# Patient Record
Sex: Female | Born: 2000 | Race: White | Hispanic: No | Marital: Single | Smoking: Never smoker
Health system: Southern US, Community
[De-identification: ages and names within clinical notes are randomized; demographics above are authoritative.]

## PROBLEM LIST (undated history)

## (undated) DIAGNOSIS — F329 Major depressive disorder, single episode, unspecified: Secondary | ICD-10-CM

## (undated) DIAGNOSIS — F32A Depression, unspecified: Secondary | ICD-10-CM

## (undated) DIAGNOSIS — F419 Anxiety disorder, unspecified: Secondary | ICD-10-CM

## (undated) DIAGNOSIS — F4310 Post-traumatic stress disorder, unspecified: Secondary | ICD-10-CM

## (undated) DIAGNOSIS — E343 Short stature due to endocrine disorder: Secondary | ICD-10-CM

## (undated) DIAGNOSIS — F909 Attention-deficit hyperactivity disorder, unspecified type: Secondary | ICD-10-CM

## (undated) DIAGNOSIS — E34328 Other genetic causes of short stature: Secondary | ICD-10-CM

## (undated) HISTORY — PX: DENTAL SURGERY: SHX609

---
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
## 2024-06-19 ENCOUNTER — Encounter: Payer: Self-pay | Admitting: Emergency Medicine

## 2024-06-19 ENCOUNTER — Emergency Department
Admission: EM | Admit: 2024-06-19 | Discharge: 2024-06-19 | Disposition: A | Discharge status: Home / Self Care | Attending: Emergency Medicine | Admitting: Emergency Medicine

## 2024-06-19 ENCOUNTER — Other Ambulatory Visit: Payer: Self-pay

## 2024-06-19 DIAGNOSIS — N920 Excessive and frequent menstruation with regular cycle: Secondary | ICD-10-CM | POA: Diagnosis not present

## 2024-06-19 DIAGNOSIS — R109 Unspecified abdominal pain: Secondary | ICD-10-CM

## 2024-06-19 DIAGNOSIS — R110 Nausea: Secondary | ICD-10-CM | POA: Diagnosis not present

## 2024-06-19 DIAGNOSIS — R1030 Lower abdominal pain, unspecified: Secondary | ICD-10-CM | POA: Diagnosis present

## 2024-06-19 LAB — COMPREHENSIVE METABOLIC PANEL WITH GFR
ALT: 11 U/L (ref 0–44)
AST: 31 U/L (ref 15–41)
Albumin: 4.5 g/dL (ref 3.5–5.0)
Alkaline Phosphatase: 56 U/L (ref 38–126)
Anion gap: 15 (ref 5–15)
BUN: 9 mg/dL (ref 6–20)
CO2: 17 mmol/L — ABNORMAL LOW (ref 22–32)
Calcium: 9.8 mg/dL (ref 8.9–10.3)
Chloride: 108 mmol/L (ref 98–111)
Creatinine, Ser: 0.88 mg/dL (ref 0.44–1.00)
GFR, Estimated: >60 mL/min (ref >60)
Glucose, Bld: 113 mg/dL — ABNORMAL HIGH (ref 70–99)
Potassium: 3.8 mmol/L (ref 3.5–5.1)
Sodium: 140 mmol/L (ref 135–145)
Total Bilirubin: 1.1 mg/dL (ref 0.0–1.2)
Total Protein: 7.4 g/dL (ref 6.5–8.1)

## 2024-06-19 LAB — CBC
HCT: 36.5 % (ref 36.0–46.0)
Hemoglobin: 12.7 g/dL (ref 12.0–15.0)
MCH: 31.5 pg (ref 26.0–34.0)
MCHC: 34.8 g/dL (ref 30.0–36.0)
MCV: 90.6 fL (ref 80.0–100.0)
Platelets: 260 K/uL (ref 150–400)
RBC: 4.03 MIL/uL (ref 3.87–5.11)
RDW: 11.7 % (ref 11.5–15.5)
WBC: 10.3 K/uL (ref 4.0–10.5)
nRBC: 0 % (ref 0.0–0.2)

## 2024-06-19 LAB — URINALYSIS, ROUTINE W REFLEX MICROSCOPIC
Bilirubin Urine: NEGATIVE
Glucose, UA: NEGATIVE mg/dL
Ketones, ur: 5 mg/dL — AB
Leukocytes,Ua: NEGATIVE
Nitrite: NEGATIVE
Protein, ur: 30 mg/dL — AB
Specific Gravity, Urine: 1.027 (ref 1.005–1.030)
pH: 6 (ref 5.0–8.0)

## 2024-06-19 LAB — LIPASE, BLOOD: Lipase: 27 U/L (ref 11–51)

## 2024-06-19 LAB — POC URINE PREG, ED: Preg Test, Ur: NEGATIVE

## 2024-06-19 MED ORDER — ONDANSETRON 4 MG PO TBDP
4.0000 mg | ORAL_TABLET | Freq: Once | ORAL | Status: AC
  Administered 2024-06-19: 4 mg via ORAL
  Filled 2024-06-19: qty 1

## 2024-06-19 MED ORDER — ONDANSETRON 4 MG PO TBDP: 4.0000 mg | ORAL_TABLET | Freq: Three times a day (TID) | ORAL | 0 refills | Status: AC | PRN

## 2024-06-19 MED ORDER — KETOROLAC TROMETHAMINE 30 MG/ML IJ SOLN
30.0000 mg | Freq: Once | INTRAMUSCULAR | Status: AC
  Administered 2024-06-19: 30 mg via INTRAMUSCULAR
  Filled 2024-06-19: qty 1

## 2024-06-19 NOTE — Discharge Instructions (Addendum)
 Your evaluated in the ED for abdominal cramping during your menstrual cycle.  Your lab work and physical exam is reassuring. Please follow up with OBGYN for further management.   Pain control:  Ibuprofen  (motrin /aleve/advil ) - You can take 3 tablets (600 mg) every 6 hours as needed for pain/fever.  Acetaminophen  (tylenol ) - You can take 2 extra strength tablets (1000 mg) every 6 hours as needed for pain/fever.  You can alternate these medications or take them together.  Make sure you eat food/drink water when taking these medications.

## 2024-06-19 NOTE — ED Triage Notes (Signed)
 Patient to ED via POV for lower abd cramping. Pt states she started her period a couple minutes ago and that's when the pain started with nausea.

## 2024-06-19 NOTE — ED Provider Notes (Signed)
 [facility] Emergency Department Provider Note     Event Date/Time   First MD Initiated Contact with Patient 06/19/24 1713     (approximate)   History   Abdominal Cramping   HPI  Cassandra Dalton is a 23 y.o. female with a past medical history of ADHD, depression, anxiety, PTSD and dwarfism presents to the ED for evaluation of lower abdominal cramping with onset of today.  Patient reports cramping began following her menstrual cycle that started today.  Patient also notes heavier bleeding than normal.  She states she was 5 days late for her menstrual cycle and believes the bleeding may be due to to a normal menstrual cycle. Patient endorses nausea without active vomiting.  No other complaints at this time.    Physical Exam   Triage Vital Signs: ED Triage Vitals  Encounter Vitals Group     BP 06/19/24 1605 129/80     Girls Systolic BP Percentile --      Girls Diastolic BP Percentile --      Boys Systolic BP Percentile --      Boys Diastolic BP Percentile --      Pulse Rate 06/19/24 1605 (!) 104     Resp 06/19/24 1605 17     Temp 06/19/24 1605 97.9 F (36.6 C)     Temp Source 06/19/24 1605 Axillary     SpO2 06/19/24 1605 100 %     Weight 06/19/24 1603 112 lb (50.8 kg)     Height 06/19/24 1603 4' 9.5 (1.461 m)     Head Circumference --      Peak Flow --      Pain Score 06/19/24 1603 10     Pain Loc --      Pain Education --      Exclude from Growth Chart --     Most recent vital signs: Vitals:   06/19/24 1605 06/19/24 1751  BP: 129/80   Pulse: (!) 104   Resp: 17   Temp: 97.9 F (36.6 C)   SpO2: 100% 100%   General: Well appearing and comfortable. Alert and oriented. INAD.  Skin:  Warm, dry and intact. No rashes or lesions noted.     Head:  NCAT.  CV:  Good peripheral perfusion. RRR.  RESP:  Normal effort. LCTAB. No retractions.  ABD:  No distention. Soft, mild tenderness to bilateral suprapubic region. (-) McBurney's point. No  masses or organomegaly. No CVA tenderness bilaterally.   ED Results / Procedures / Treatments   Labs (all labs ordered are listed, but only abnormal results are displayed) Labs Reviewed  COMPREHENSIVE METABOLIC PANEL WITH GFR - Abnormal; Notable for the following components:      Result Value   CO2 17 (*)    Glucose, Bld 113 (*)    All other components within normal limits  URINALYSIS, ROUTINE W REFLEX MICROSCOPIC - Abnormal; Notable for the following components:   Color, Urine YELLOW (*)    APPearance HAZY (*)    Hgb urine dipstick LARGE (*)    Ketones, ur 5 (*)    Protein, ur 30 (*)    Bacteria, UA RARE (*)    All other components within normal limits  LIPASE, BLOOD  CBC  POC URINE PREG, ED   No results found.  PROCEDURES:  Critical Care performed: No  Procedures   MEDICATIONS ORDERED IN ED: Medications  ketorolac  (TORADOL ) 30 MG/ML injection 30 mg (30 mg Intramuscular Given 06/19/24 1752)  ondansetron  (ZOFRAN -ODT) disintegrating tablet 4 mg (4 mg Oral Given 06/19/24 1752)     IMPRESSION / MDM / ASSESSMENT AND PLAN / ED COURSE  I reviewed the triage vital signs and the nursing notes.                               23 y.o. female presents to the emergency department for evaluation and treatment of abdominal cramping during menstrual cycle. See HPI for further details.   Differential diagnosis includes, but is not limited to, ovarian cyst, ovarian torsion, acute appendicitis, urinary tract infection/pyelonephritis, endometriosis, fibroids, pregnancy related pain including ectopic pregnancy, normal abdominal cramping due to menstrual cycle   Patient is alert and oriented.  She is hemodynamic stable.  Initial pulse rate noted to be 104 bpm.  She is afebrile and on initial assessment well-appearing during my physical exam. Labs are reassuring with no significant anemia, leukocytosis, electrolyte abnormality, or AKI.  ED pain management with Toradol  and Zofran .  Patient  feels well on reassessment, no findings concerning for appendicitis or ovarian torsion.  I do suspect presentation is consistent with normal abdominal cramping due to onset of menstrual cycle.  Encouraged to follow-up with OB/GYN to establish care.  She is appropriate for discharge home with outpatient follow-up, was counseled to return to the ED for new or worsening symptoms. Patient agrees with plan.  ED return precaution discussed.  Patient stable at discharge.   Patient's presentation is most consistent with acute complicated illness / injury requiring diagnostic workup.  FINAL CLINICAL IMPRESSION(S) / ED DIAGNOSES   Final diagnoses:  Abdominal cramping  Menorrhagia with regular cycle   Rx / DC Orders   ED Discharge Orders          Ordered    ondansetron  (ZOFRAN -ODT) 4 MG disintegrating tablet  Every 8 hours PRN        06/19/24 1836             Note:  This document was prepared using Dragon voice recognition software and may include unintentional dictation errors.    Margrette, Nadiya Pieratt A, PA-C 06/19/24 CONRAD    Arlander Charleston, MD 06/19/24 [phone]

## 2024-10-09 ENCOUNTER — Emergency Department

## 2024-10-09 ENCOUNTER — Emergency Department
Admission: EM | Admit: 2024-10-09 | Discharge: 2024-10-09 | Disposition: A | Discharge status: Home / Self Care | Attending: Emergency Medicine | Admitting: Emergency Medicine

## 2024-10-09 ENCOUNTER — Other Ambulatory Visit: Payer: Self-pay

## 2024-10-09 DIAGNOSIS — M791 Myalgia, unspecified site: Secondary | ICD-10-CM | POA: Diagnosis present

## 2024-10-09 DIAGNOSIS — M7918 Myalgia, other site: Secondary | ICD-10-CM

## 2024-10-09 DIAGNOSIS — Y92410 Unspecified street and highway as the place of occurrence of the external cause: Secondary | ICD-10-CM | POA: Insufficient documentation

## 2024-10-09 DIAGNOSIS — J069 Acute upper respiratory infection, unspecified: Secondary | ICD-10-CM | POA: Insufficient documentation

## 2024-10-09 LAB — RESP PANEL BY RT-PCR (RSV, FLU A&B, COVID)  RVPGX2
Influenza A by PCR: NEGATIVE
Influenza B by PCR: NEGATIVE
Resp Syncytial Virus by PCR: NEGATIVE
SARS Coronavirus 2 by RT PCR: NEGATIVE

## 2024-10-09 LAB — GROUP A STREP BY PCR: Group A Strep by PCR: NOT DETECTED

## 2024-10-09 MED ORDER — NAPROXEN 500 MG PO TABS: 500.0000 mg | ORAL_TABLET | Freq: Two times a day (BID) | ORAL | 0 refills | Status: AC

## 2024-10-09 MED ORDER — PSEUDOEPH-BROMPHEN-DM 30-2-10 MG/5ML PO SYRP: 5.0000 mL | ORAL_SOLUTION | Freq: Four times a day (QID) | ORAL | 0 refills | Status: AC | PRN
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
## 2024-10-09 NOTE — Discharge Instructions (Signed)
 Follow-up with your primary care provider if any continued problems or concerns.  Discontinue taking the ibuprofen  and begin taking naproxen  500 mg twice daily.  A prescription for Bromfed-DM was sent to the pharmacy to help with cough and congestion.  Increase fluids to stay hydrated.  You may take Tylenol  with the prescribed medication if additional pain medication is needed and also for fever if needed.  Moist warm compresses to your muscles as needed.  The viral illness may take 4 to 5 days before you start feeling better.
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
